# Patient Record
Sex: Female | Born: 1960 | Race: White | Hispanic: No | Marital: Married | Smoking: Never smoker
Health system: Southern US, Community
[De-identification: ages and names within clinical notes are randomized; demographics above are authoritative.]

---

## 2001-10-16 ENCOUNTER — Other Ambulatory Visit: Admission: RE | Admit: 2001-10-16 | Discharge: 2001-10-16 | Payer: Self-pay | Admitting: Family Medicine

## 2004-07-10 ENCOUNTER — Ambulatory Visit: Payer: Self-pay | Admitting: Family Medicine

## 2005-09-19 ENCOUNTER — Ambulatory Visit: Payer: Self-pay | Admitting: Family Medicine

## 2005-10-01 ENCOUNTER — Ambulatory Visit: Payer: Self-pay | Admitting: Family Medicine

## 2006-10-04 IMAGING — US ULTRASOUND RIGHT BREAST
1 series · 17 of 22 positions shown · non-contrast
Comparison: none

REASON FOR EXAM: Nodularity 6 o'clock
COMMENTS:

[Series 1: ultrasound right breast · 17 of 22 slices shown]
[im 1/22]
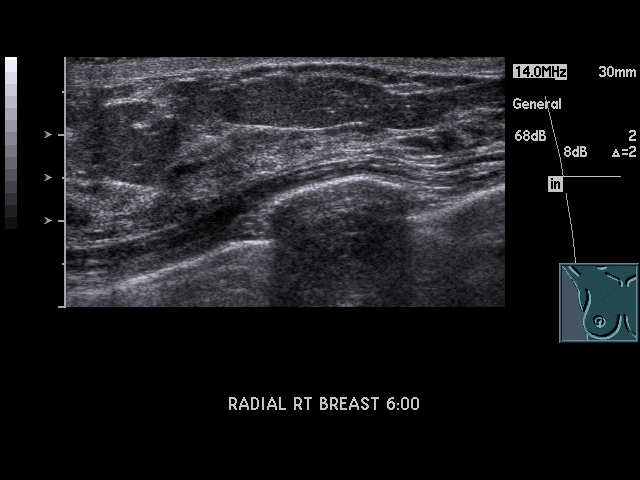
[im 2/22]
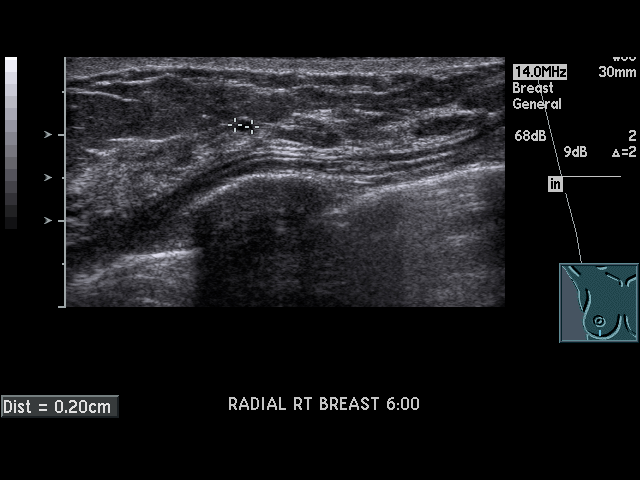
[im 4/22]
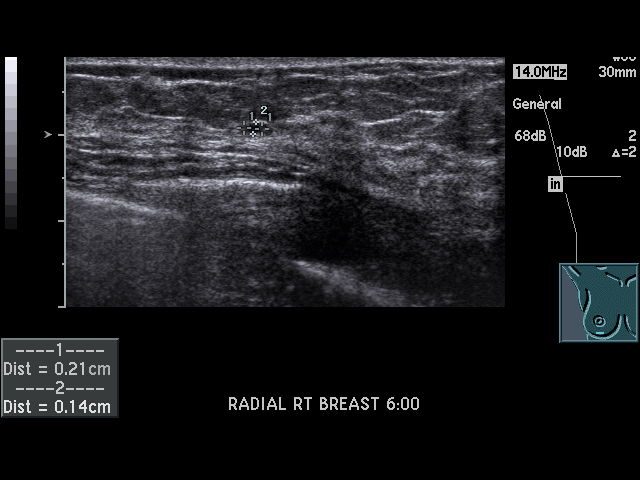
[im 5/22]
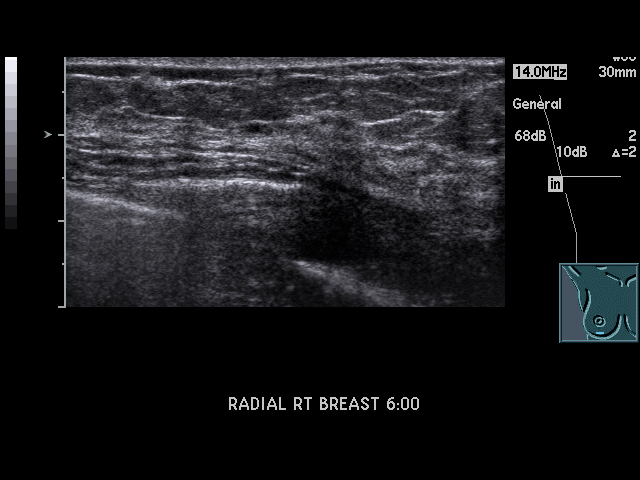
[im 6/22]
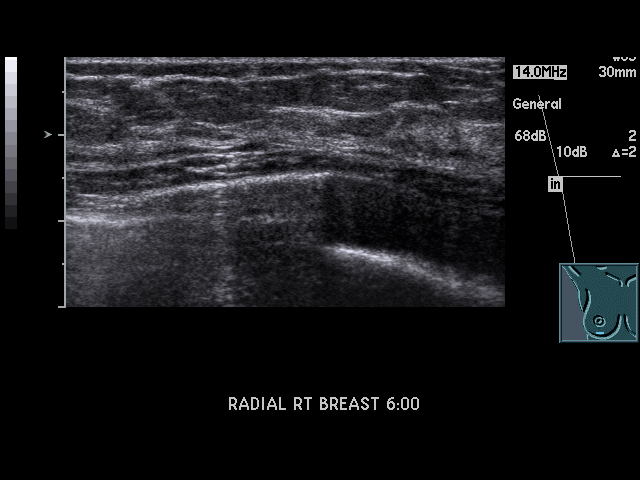
[im 8/22]
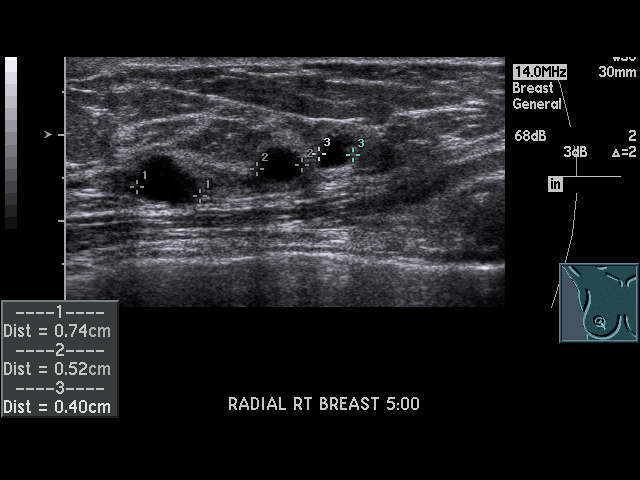
[im 9/22]
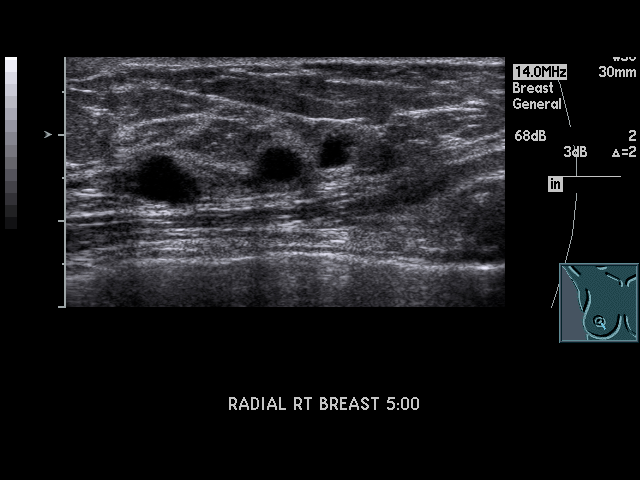
[im 10/22]
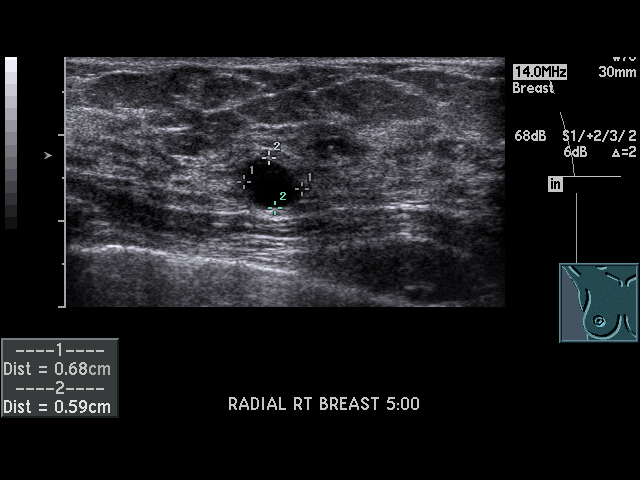
[im 12/22]
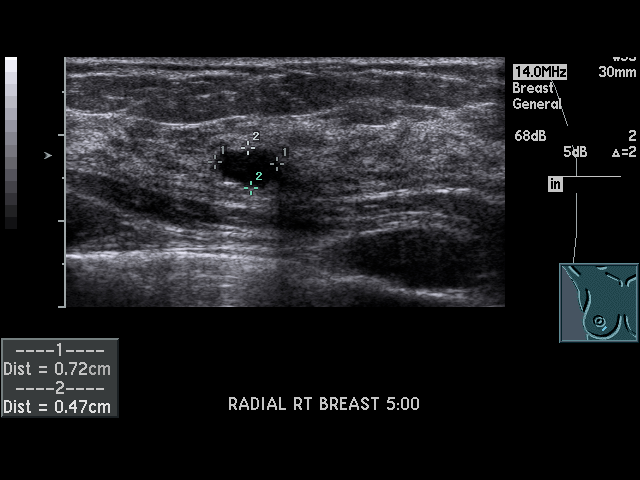
[im 13/22]
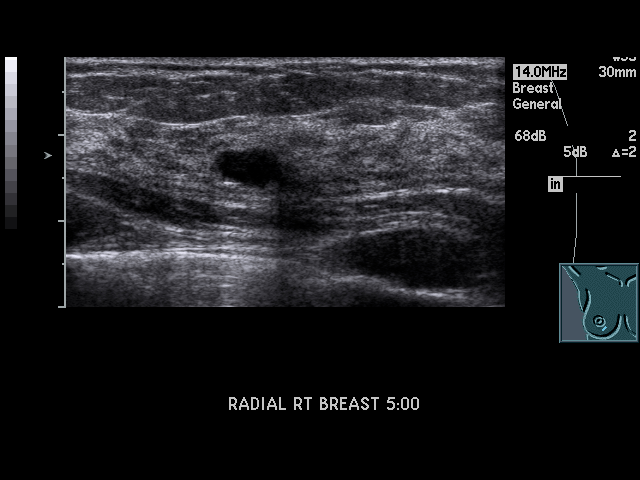
[im 14/22]
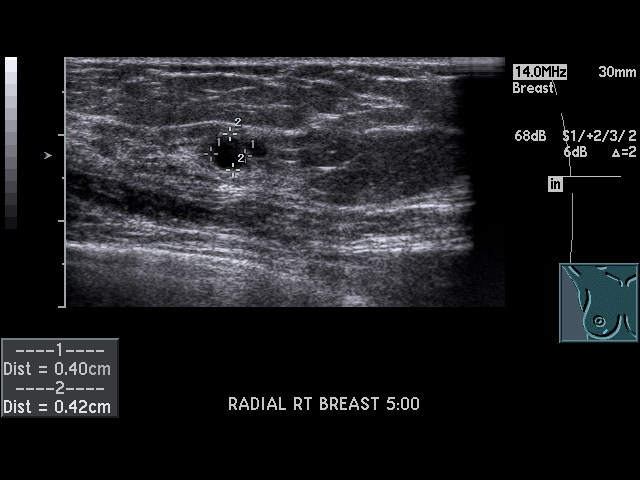
[im 15/22]
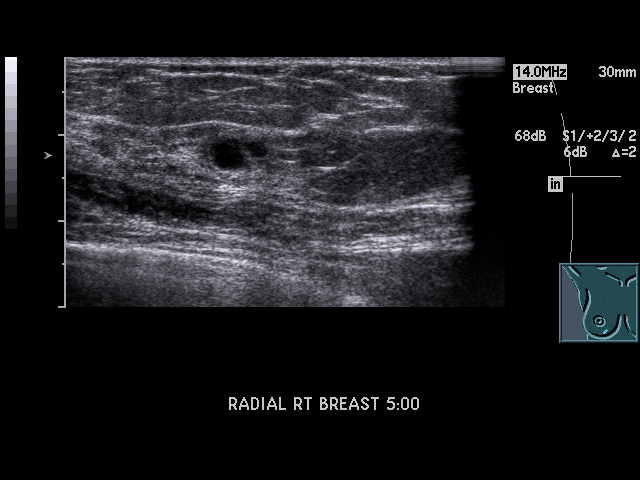
[im 17/22]
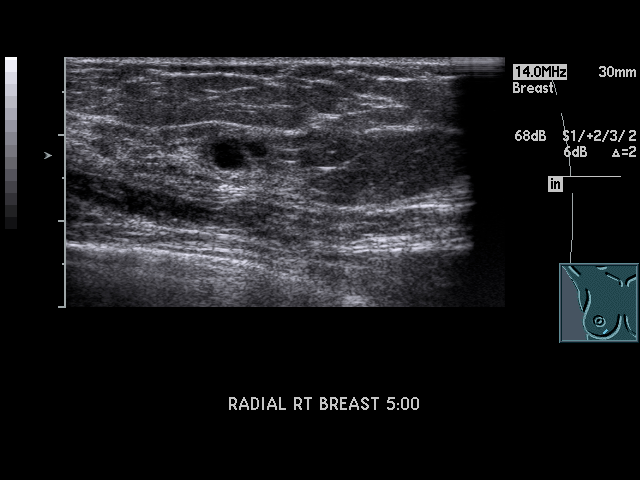
[im 18/22]
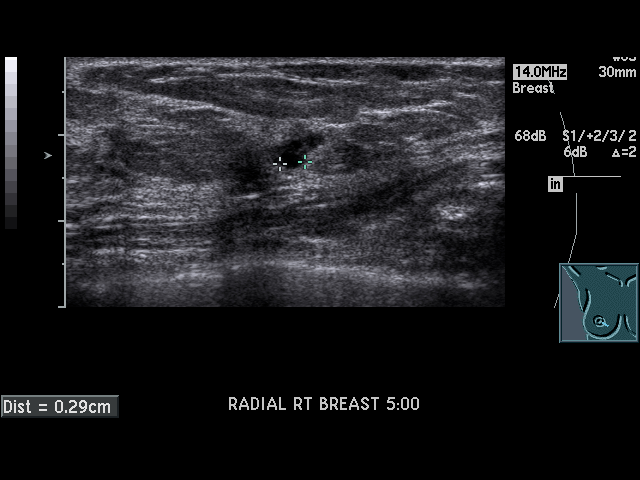
[im 19/22]
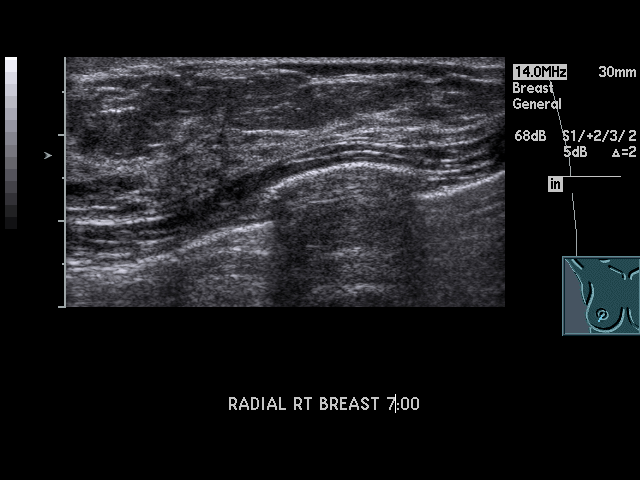
[im 21/22]
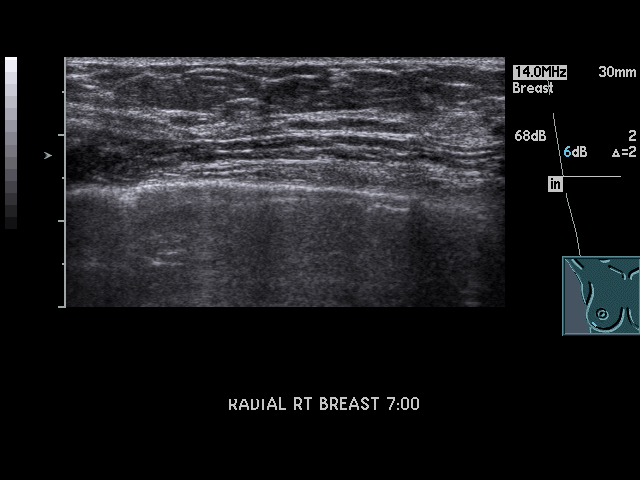
[im 22/22]
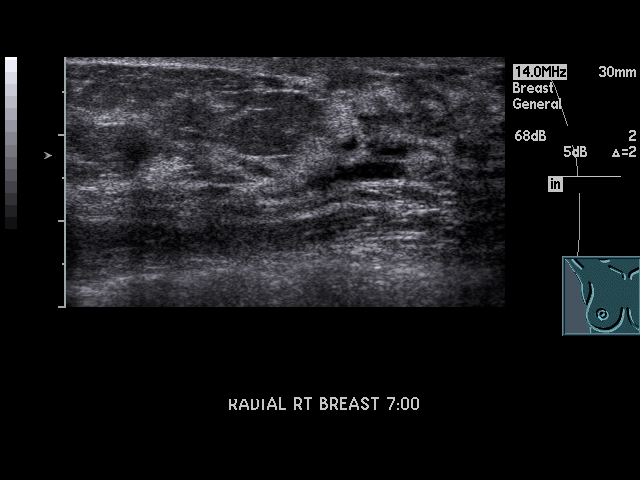

[17 of 22 positions shown; findings below may reference images not displayed]

PROCEDURE:     US  - US BREAST RIGHT  - October 01, 2005  [DATE]

RESULT:     On the patient's screening study of 09/19/05 nodularity was
noted on the RIGHT and ultrasound was requested from the 4 o'clock to 8
o'clock position.

The RIGHT breast demonstrates the presence of multiple cystic-appearing
structures.  At the 6 o'clock position there is an approximately 2 mm
diameter cyst.  At the 5 o'clock position there are a total of four cysts
seen.  The largest of these measures approximately 7 mm in greatest
dimension.  I see no suspicious shadowing or other areas of abnormality.
IMPRESSION: There are multiple cystic-appearing structures at the 4 o'clock to 8 o'clock
position of the RIGHT breast, which is felt to be consistent with the
patient's mammographic findings.

RECOMMENDATION: Please return to yearly mammographic followup of both
breasts.

## 2006-10-30 DIAGNOSIS — R319 Hematuria, unspecified: Secondary | ICD-10-CM | POA: Insufficient documentation

## 2006-11-20 ENCOUNTER — Ambulatory Visit: Payer: Self-pay | Admitting: Family Medicine

## 2006-11-27 ENCOUNTER — Ambulatory Visit: Payer: Self-pay | Admitting: Family Medicine

## 2008-08-31 ENCOUNTER — Ambulatory Visit: Payer: Self-pay | Admitting: Family Medicine

## 2010-07-25 LAB — HM PAP SMEAR: HM Pap smear: NEGATIVE

## 2010-08-29 ENCOUNTER — Ambulatory Visit: Payer: Self-pay

## 2011-06-05 ENCOUNTER — Ambulatory Visit: Payer: Self-pay | Admitting: Family Medicine

## 2011-09-18 ENCOUNTER — Ambulatory Visit: Payer: Self-pay | Admitting: Family Medicine

## 2012-06-07 IMAGING — US US PELV - US TRANSVAGINAL
1 series · 17 of 25 positions shown · non-contrast
Comparison: none

REASON FOR EXAM: menorrhagia
COMMENTS:

PROCEDURE:     US  - US PELVIS EXAM W/TRANSVAGINAL  - June 05, 2011  [DATE]
RESULT:     Comparison: None
INDICATION: Menorrhagia.
TECHNIQUE: Multiple transabdominal gray-scale images and endovaginal
gray-scale images with doppler images of the pelvis performed.

[Series 1: us pelv - us transvaginal · 17 of 88 slices shown]
[im 1/88]
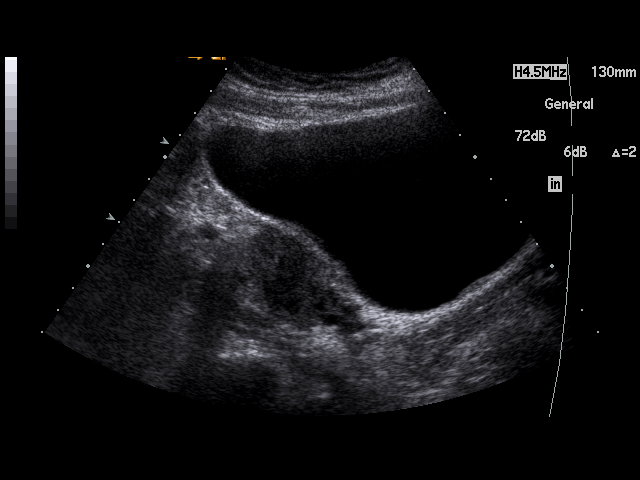
[im 8/88]
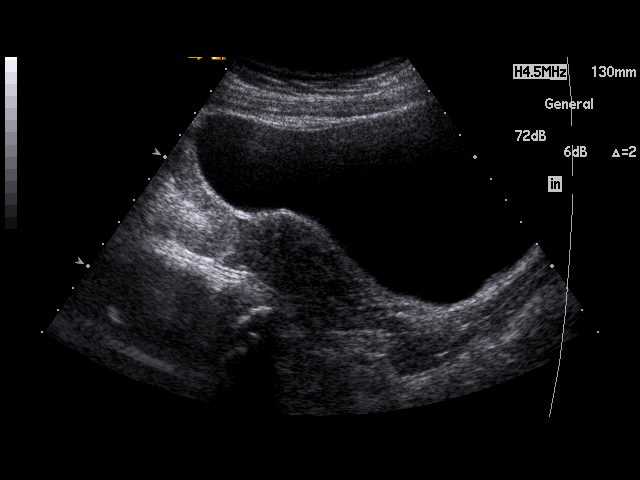
[im 11/88]
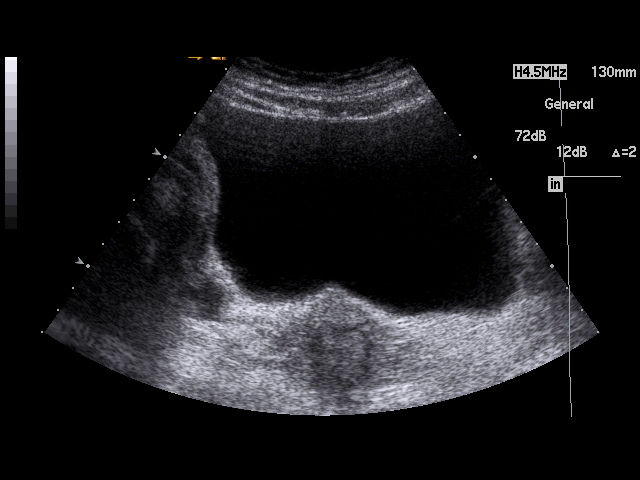
[im 19/88]
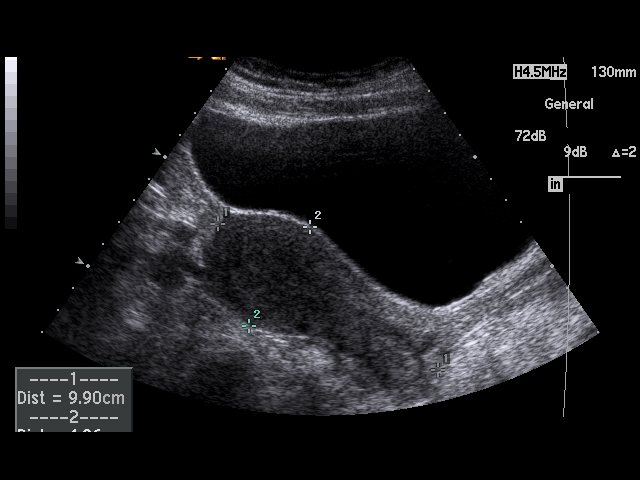
[im 22/88]
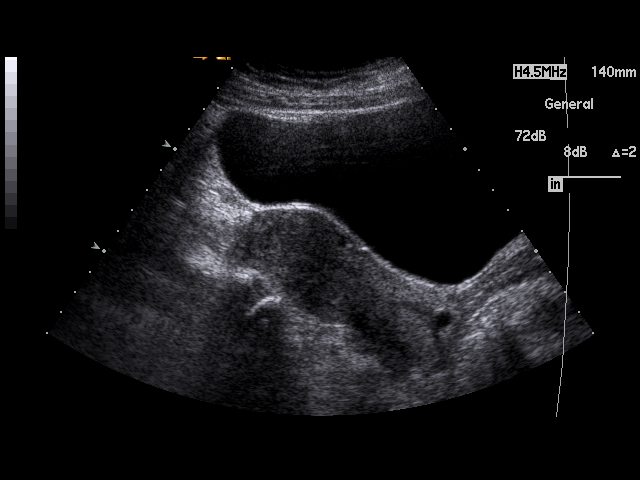
[im 30/88]
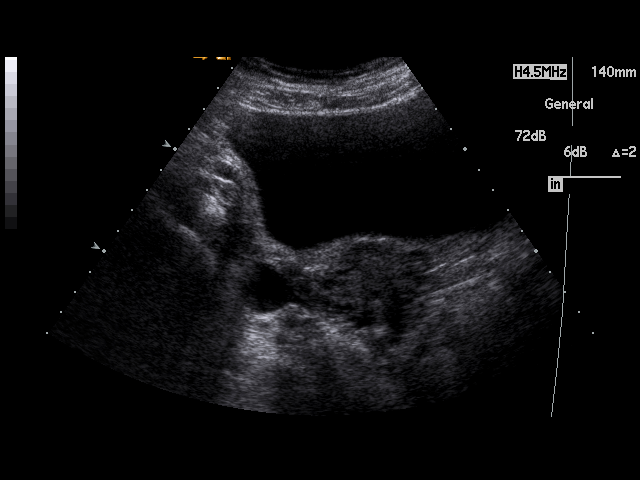
[im 33/88]
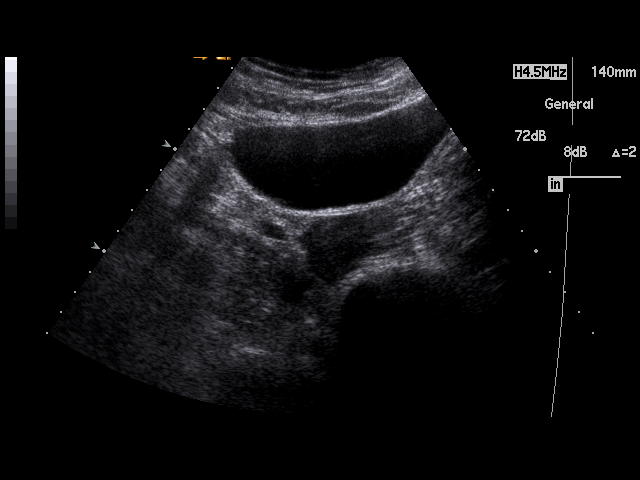
[im 40/88]
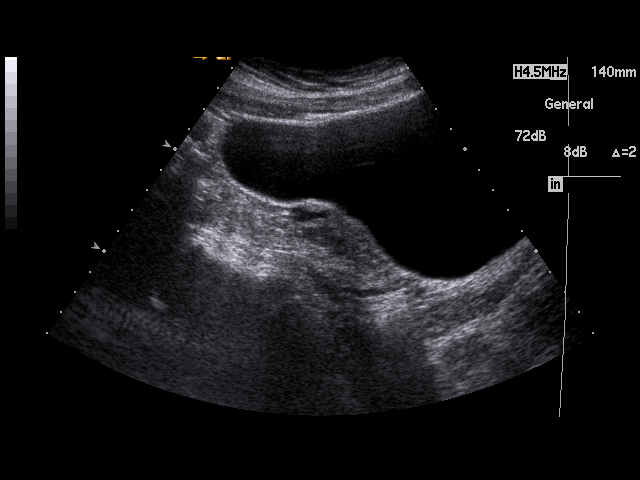
[im 44/88]
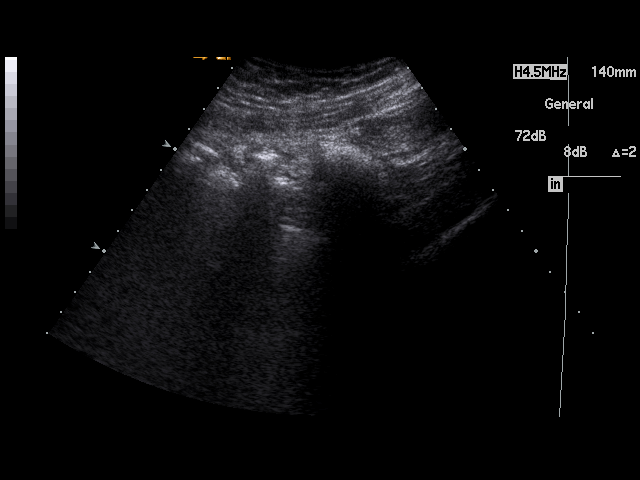
[im 48/88]
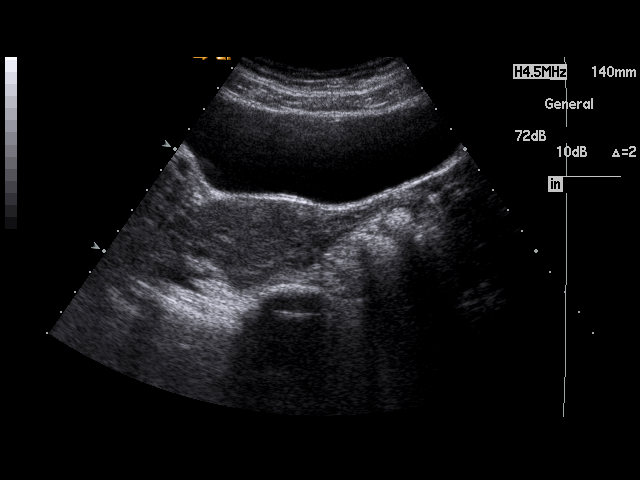
[im 55/88]
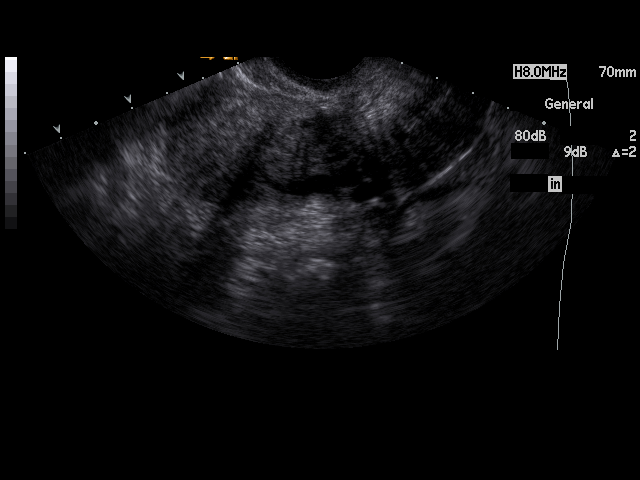
[im 59/88]
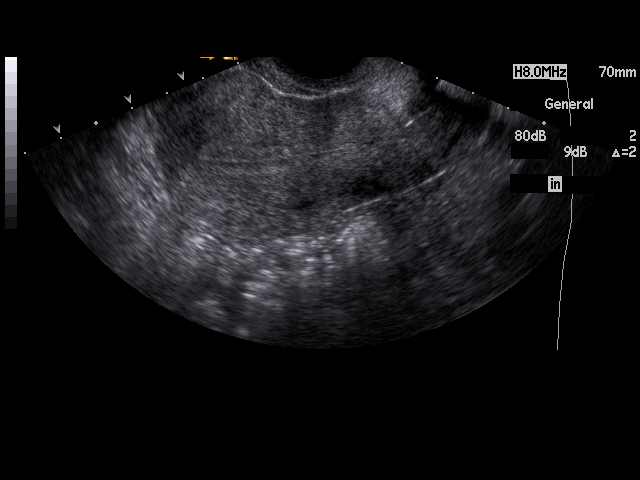
[im 66/88]
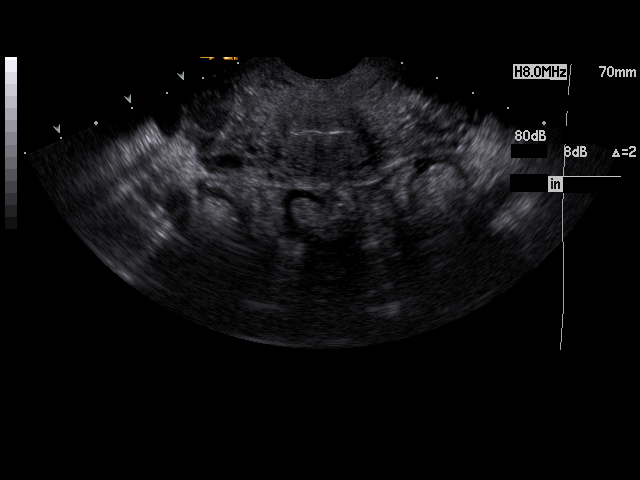
[im 69/88]
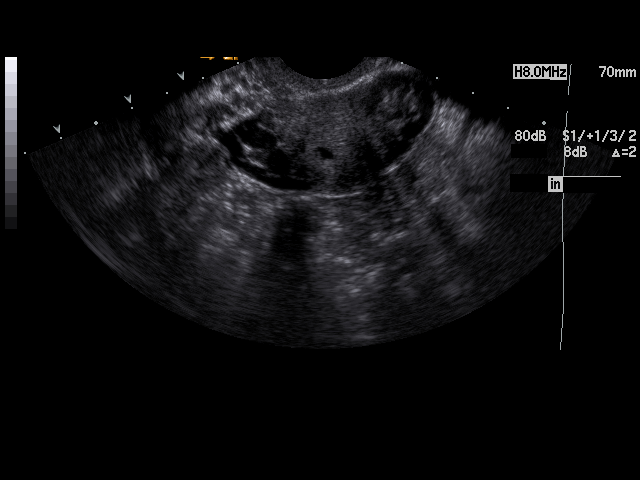
[im 77/88]
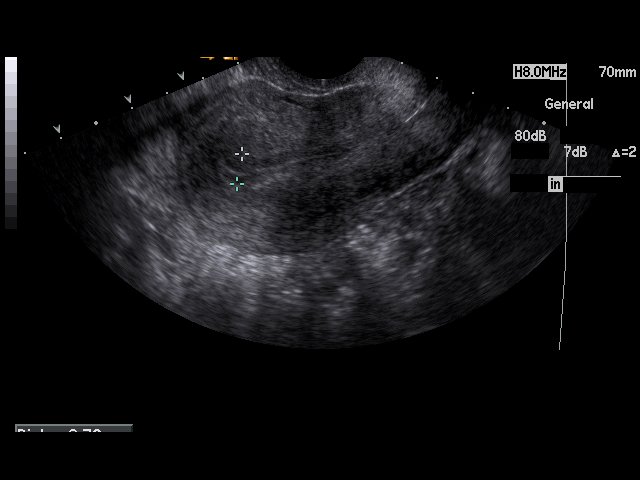
[im 80/88]
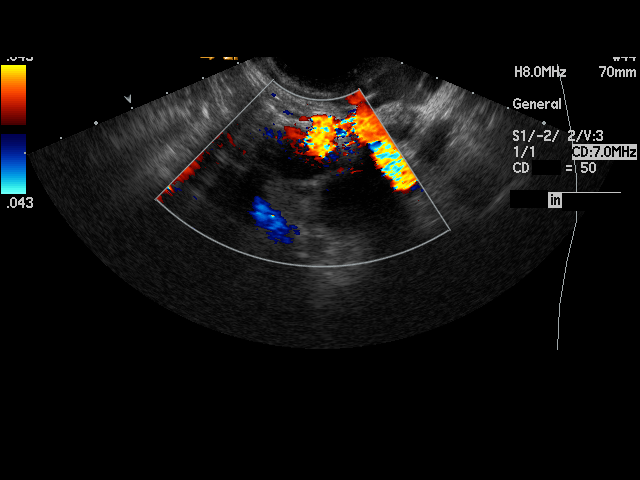
[im 88/88]
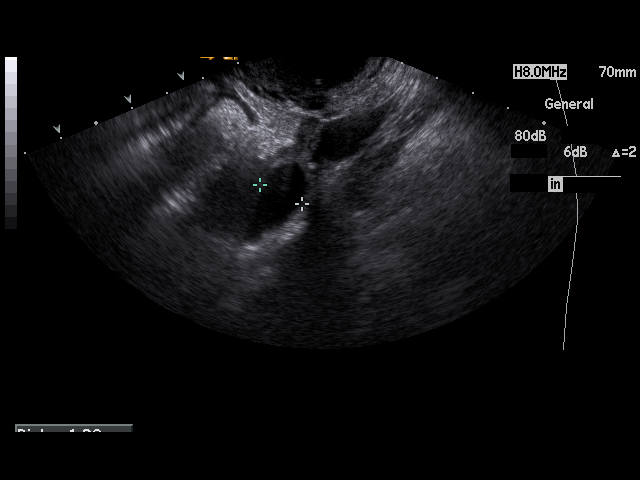

[17 of 25 positions shown; findings below may reference images not displayed]

FINDINGS: The uterus is normal in echotexture measuring 9.9 x 4.4 x 5.4 cm , with
transabdominal ultrasound. The endometrial stripe is uniform and homogeneous
measuring 9.4 mm.  There are no abnormal solid or cystic myometrial mass
lesions noted.

The right ovary measures 3.4 x 2.6 x 2.9 cm.  The left ovary measures 2.7 x
1.8 x 2.2 cm. There is no adnexal mass.

There is no pelvic free fluid.
IMPRESSION: Normal pelvic ultrasound.

## 2012-09-20 IMAGING — MG MM CAD SCREENING MAMMO
1 series · 4 of 4 positions shown · non-contrast
Comparison: none

REASON FOR EXAM: scr mammo no order
COMMENTS:

PROCEDURE:     MAM - MAM DGTL SCRN MAM NO ORDER W/CAD  - September 18, 2011  [DATE]
RESULT:     COMPARISON:  08/29/2010, 08/31/2008, 11/20/2006
TECHNIQUE: Digital screening mammograms were obtained. FDA approved
computer-aided detection (CAD) for mammography was utilized for this study.

[Series 8449: R CC · right · 4 of 4 slices shown]
[im 1/4]
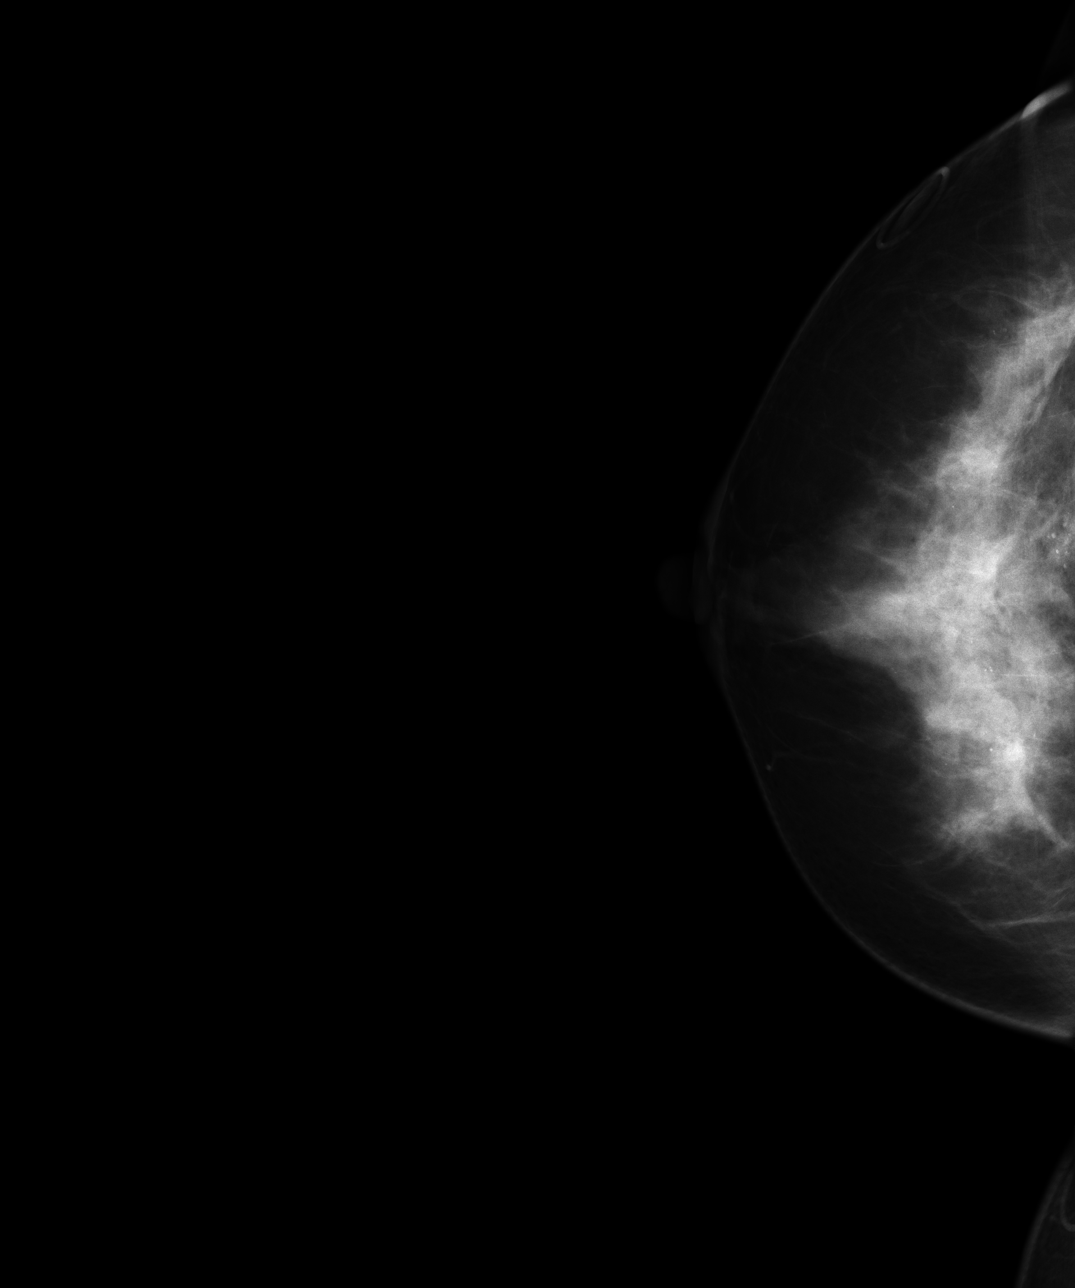
[im 2/4]
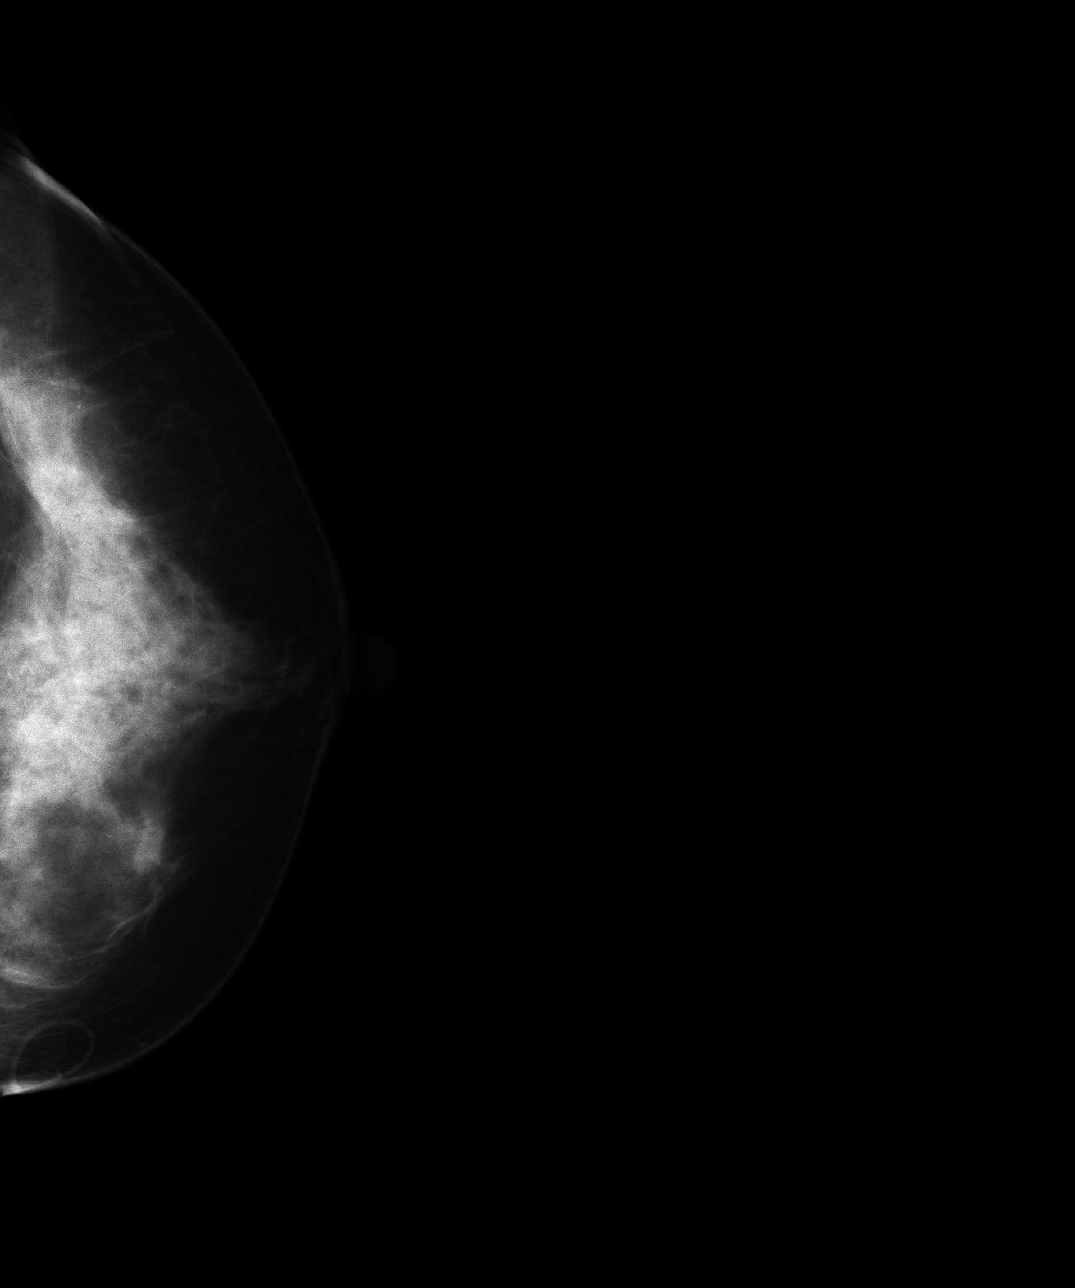
[im 3/4]
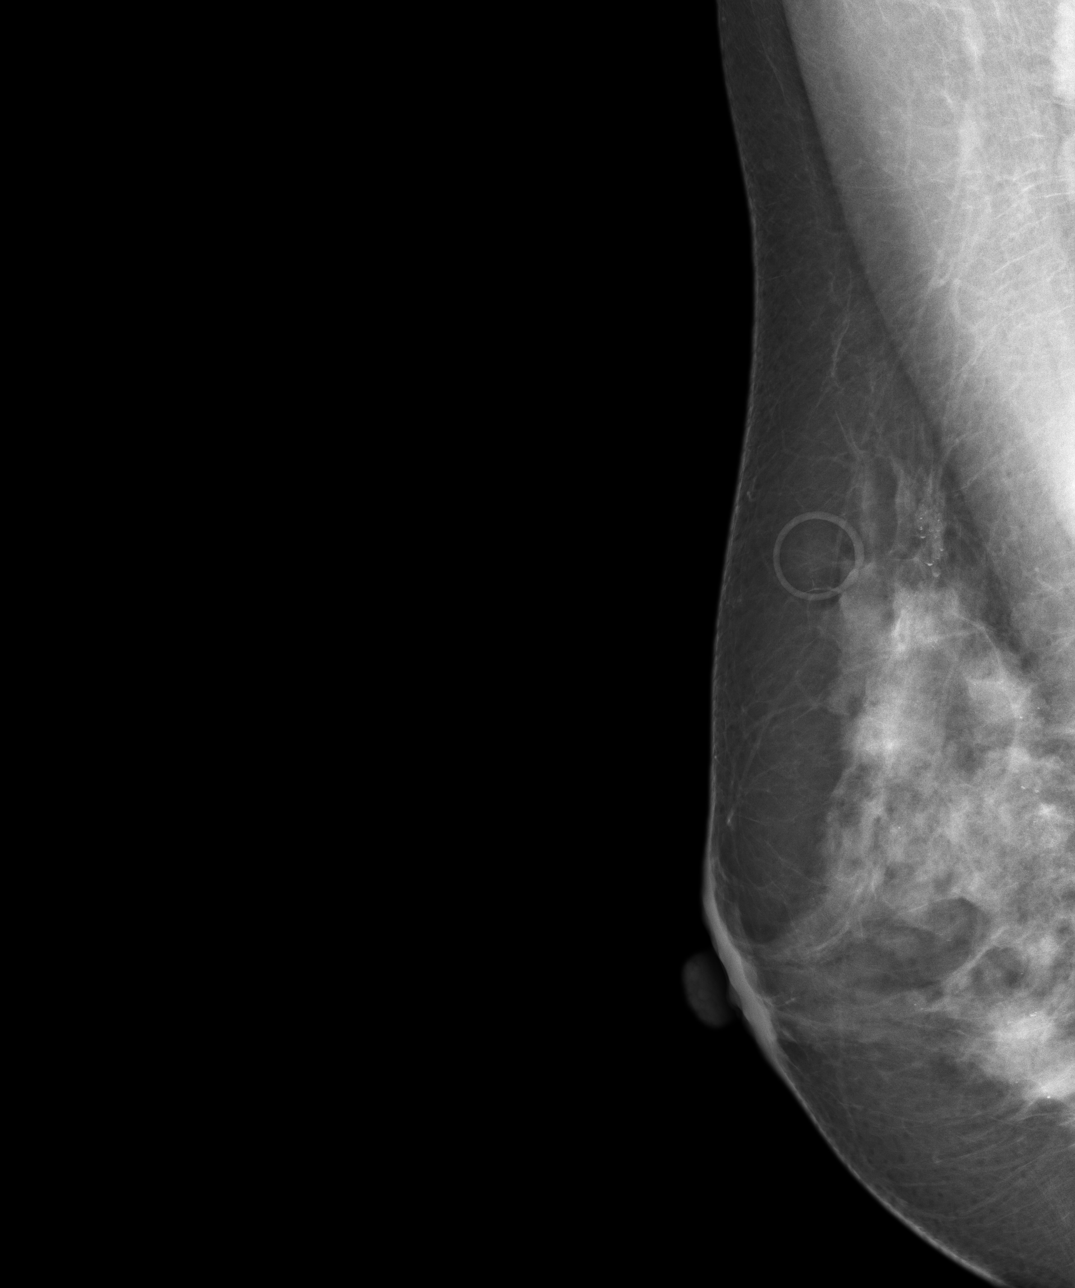
[im 4/4]
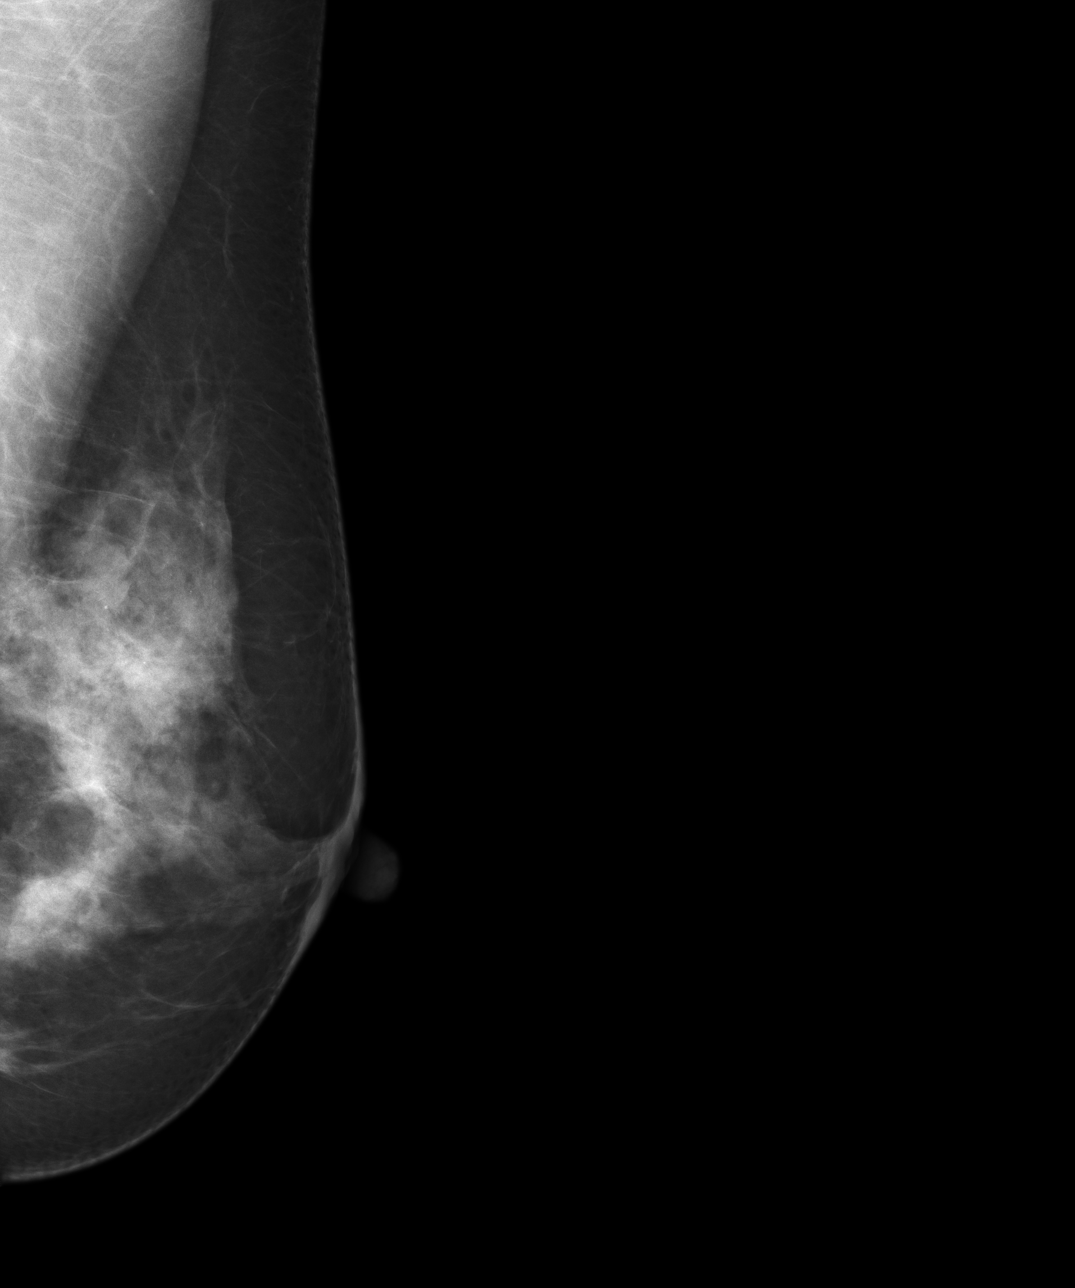

[4 of 4 positions shown; findings below may reference images not displayed]

FINDING: Bilateral breasts are dense lowering the sensitivity of mammography. There
are ossifications in the posterior third of the right breast which on the
MLO view layer dependently consistent with milk of calcium. There is no
dominant mass, architectural distortion or clusters of suspicious
microcalcifications.
IMPRESSION: 1.     Stable bilateral mammogram.
2.     Annual mammographic follow up recommended.
3.     BI-RADS:  Category 2- Benign.

A negative mammogram report does not preclude biopsy or other evaluation of
a clinically palpable or otherwise suspicious mass or lesion. Breast cancer
may not be detected by mammography in up to 10% of cases.

## 2013-08-10 ENCOUNTER — Ambulatory Visit: Payer: Self-pay | Admitting: Family Medicine

## 2013-08-10 LAB — HM HEPATITIS C SCREENING LAB: HM Hepatitis Screen: NEGATIVE

## 2014-10-19 ENCOUNTER — Ambulatory Visit: Payer: Self-pay | Admitting: Family Medicine

## 2014-10-19 LAB — HM MAMMOGRAPHY

## 2014-11-07 ENCOUNTER — Ambulatory Visit: Payer: Self-pay | Admitting: Gastroenterology

## 2014-11-07 LAB — HM COLONOSCOPY

## 2016-05-09 ENCOUNTER — Ambulatory Visit (INDEPENDENT_AMBULATORY_CARE_PROVIDER_SITE_OTHER): Payer: BC Managed Care – PPO | Admitting: Family Medicine

## 2016-05-09 ENCOUNTER — Encounter: Payer: Self-pay | Admitting: Family Medicine

## 2016-05-09 VITALS — BP 110/64 | HR 72 | Temp 98.2°F | Resp 16 | Ht 68.0 in | Wt 170.0 lb

## 2016-05-09 DIAGNOSIS — N3090 Cystitis, unspecified without hematuria: Secondary | ICD-10-CM

## 2016-05-09 DIAGNOSIS — E559 Vitamin D deficiency, unspecified: Secondary | ICD-10-CM | POA: Insufficient documentation

## 2016-05-09 DIAGNOSIS — J309 Allergic rhinitis, unspecified: Secondary | ICD-10-CM | POA: Insufficient documentation

## 2016-05-09 DIAGNOSIS — E7800 Pure hypercholesterolemia, unspecified: Secondary | ICD-10-CM | POA: Insufficient documentation

## 2016-05-09 DIAGNOSIS — Z9889 Other specified postprocedural states: Secondary | ICD-10-CM | POA: Insufficient documentation

## 2016-05-09 LAB — POCT UA - MICROSCOPIC ONLY
CASTS, UR, LPF, POC: NONE SEEN
CRYSTALS, UR, HPF, POC: NONE SEEN
EPITHELIAL CELLS, URINE PER MICROSCOPY: NONE SEEN
MUCUS UA: NONE SEEN
YEAST UA: NONE SEEN

## 2016-05-09 LAB — POCT URINALYSIS DIPSTICK
GLUCOSE UA: NEGATIVE
KETONES UA: NEGATIVE
Nitrite, UA: POSITIVE
Protein, UA: NEGATIVE
SPEC GRAV UA: 1.01
Urobilinogen, UA: 0.2
pH, UA: 7.5

## 2016-05-09 MED ORDER — NITROFURANTOIN MONOHYD MACRO 100 MG PO CAPS: 100.0000 mg | ORAL_CAPSULE | Freq: Two times a day (BID) | ORAL | 0 refills | Status: DC

## 2016-05-09 NOTE — Patient Instructions (Signed)

## 2016-05-09 NOTE — Progress Notes (Signed)
Patient: Morgan Cantrell Female    DOB: [birth year]   55 y.o.   MRN: [id number] Visit Date: 05/09/2016  Today's Provider: Dortha Kern, PA   Chief Complaint  Patient presents with  . Urinary Tract Infection   Subjective:    Urinary Tract Infection   This is a new problem. The current episode started yesterday. The problem has been unchanged. The quality of the pain is described as burning and aching ("pressure"). The pain is at a severity of 3/10. The pain is mild. There has been no fever. She is sexually active. There is no history of pyelonephritis. Associated symptoms include frequency and urgency. Pertinent negatives include no chills, discharge, flank pain, hematuria, hesitancy, nausea, sweats or vomiting. Treatments tried: AZO. The treatment provided moderate relief.   Urinalysis    Component Value Date/Time   BILIRUBINUR small 05/09/2016 1121   PROTEINUR negative 05/09/2016 1121   UROBILINOGEN 0.2 05/09/2016 1121   NITRITE positive 05/09/2016 1121   LEUKOCYTESUR moderate (2+) (A) 05/09/2016 1121    History reviewed. No pertinent past medical history. Patient Active Problem List   Diagnosis Date Noted  . Allergic rhinitis 05/09/2016  . H/O surgical procedure 05/09/2016  . Hypercholesterolemia without hypertriglyceridemia 05/09/2016  . Avitaminosis D 05/09/2016  . Low grade squamous intraepithelial lesion (LGSIL) on Papanicolaou smear of cervix 08/02/2008  . Blood in the urine 10/30/2006  . Melanoma of skin (HCC) 09/30/1986   History reviewed. No pertinent surgical history.   History reviewed. No pertinent family history.  Allergies  Allergen Reactions  . Penicillins   . Sulfa Antibiotics Rash and Swelling   Current Meds  Medication Sig  . Calcium-Vitamin D 500-125 MG-UNIT TABS Take by mouth.  . Omega-3 Fatty Acids (FISH OIL BURP-LESS) 1000 MG CAPS Take by mouth.    Review of Systems  Constitutional: Negative for chills.  Gastrointestinal: Negative  for nausea and vomiting.  Genitourinary: Positive for frequency and urgency. Negative for flank pain, hematuria and hesitancy.    Social History  Substance Use Topics  . Smoking status: Never Smoker  . Smokeless tobacco: Never Used  . Alcohol use Yes     Comment: occasional   Objective:   BP 110/64 (BP Location: Right Arm, Patient Position: Sitting, Cuff Size: Normal)   Pulse 72   Temp 98.2 F (36.8 C) (Oral)   Resp 16   Ht  (1.727 m)   Wt 170 lb (77.1 kg)   BMI 25.85 kg/m   Physical Exam  Constitutional: She is oriented to person, place, and time. She appears well-developed and well-nourished. No distress.  HENT:  Head: Normocephalic and atraumatic.  Right Ear: Hearing normal.  Left Ear: Hearing normal.  Nose: Nose normal.  Eyes: Conjunctivae and lids are normal. Right eye exhibits no discharge. Left eye exhibits no discharge. No scleral icterus.  Cardiovascular: Normal rate and regular rhythm.   Pulmonary/Chest: Effort normal and breath sounds normal. No respiratory distress.  Abdominal: Soft. Bowel sounds are normal. There is tenderness.  Suprapubic with no CVA tenderness to percussion posteriorly.  Musculoskeletal: Normal range of motion.  Neurological: She is alert and oriented to person, place, and time.  Skin: Skin is intact. No lesion and no rash noted.  Psychiatric: She has a normal mood and affect. Her speech is normal and behavior is normal. Thought content normal.      Assessment & Plan:     1. Cystitis Onset of dysuria and urgency over the past 24  hours. States she had an UTI 2 years ago with the same symptoms. Urinalysis showed large amount of RBC's and WBC's with 1+ bacteria. AZO-Standard helping to calm symptoms. May continue this and add Macrobid. Increase fluid intake and recheck pending C&S report. - Urine culture - POCT UA - Microscopic Only - POCT urinalysis dipstick - nitrofurantoin, macrocrystal-monohydrate, (MACROBID) 100 MG capsule; Take 1  capsule (100 mg total) by mouth 2 (two) times daily.  Dispense: 14 capsule; Refill: 0       Dortha Kernennis Laurissa Cowper, PA  [facility]

## 2016-05-11 LAB — URINE CULTURE

## 2016-05-13 ENCOUNTER — Telehealth: Payer: Self-pay

## 2016-05-13 NOTE — Telephone Encounter (Signed)
LMTCB 05/13/2016  Thanks,   -Laura  

## 2016-05-13 NOTE — Telephone Encounter (Signed)
-----   Message from Tamsen Roersennis E Chrismon, [place] sent at 05/13/2016  8:46 AM EDT ----- Culture confirms E.coli as bacteria causing infection. It is sensitive to all antibiotics tested except Ampicillin. Nitrofurantoin (Macrobid) should clear it up. Drink extra fluids to flush system and finish all the antibiotic. Recheck if any symptoms remain in 10 days.

## 2016-05-14 NOTE — Telephone Encounter (Signed)
Patient advised as directed below.   Thanks, Nikki 

## 2016-05-14 NOTE — Telephone Encounter (Signed)
Pt returned call.  She said you could leave a  Message if they dont answer.  [name],Morgan Cantrell

## 2016-11-11 ENCOUNTER — Ambulatory Visit (INDEPENDENT_AMBULATORY_CARE_PROVIDER_SITE_OTHER): Payer: BC Managed Care – PPO | Admitting: Family Medicine

## 2016-11-11 ENCOUNTER — Encounter: Payer: Self-pay | Admitting: Family Medicine

## 2016-11-11 VITALS — BP 124/66 | HR 76 | Temp 98.3°F | Resp 16 | Wt 173.0 lb

## 2016-11-11 DIAGNOSIS — J069 Acute upper respiratory infection, unspecified: Secondary | ICD-10-CM | POA: Diagnosis not present

## 2016-11-11 DIAGNOSIS — B9789 Other viral agents as the cause of diseases classified elsewhere: Secondary | ICD-10-CM

## 2016-11-11 MED ORDER — HYDROCODONE-HOMATROPINE 5-1.5 MG/5ML PO SYRP: ORAL_SOLUTION | ORAL | 0 refills | Status: DC

## 2016-11-11 NOTE — Progress Notes (Signed)
Subjective:     Patient ID: Morgan Cantrell, female   DOB: April 22, 1961, 56 y.o.   MRN: [id number]  HPI  Chief Complaint  Patient presents with  . Cough    x 6 days. Was improving but worsened last night. Productive with greenish-yellow and bloody sputum. Pt is afebrile. Denies chills. Is also c/o PND.  Reports clear sinus drainage but cough has worsened: " I couldn't sleep last night".Has been taking Mucinex DM for her sx. No hx of asthma.   Review of Systems     Objective:   Physical Exam  Constitutional: She appears well-developed and well-nourished. No distress.  Ears: T.M's intact without inflammation Throat: no tonsillar enlargement or exudate Neck: no cervical adenopathy Lungs: clear     Assessment:    1. Viral upper respiratory tract infection - HYDROcodone-homatropine (HYCODAN) 5-1.5 MG/5ML syrup; 5 ml 4-6 hours as needed for cough  Dispense: 240 mL; Refill: 0    Plan:    Continue Mucinex and Delsym as needed.

## 2016-11-11 NOTE — Patient Instructions (Signed)
Discussed use of Mucinex expectorant and Delsym for cough.

## 2017-04-28 ENCOUNTER — Encounter: Payer: Self-pay | Admitting: Physician Assistant

## 2017-04-28 ENCOUNTER — Ambulatory Visit (INDEPENDENT_AMBULATORY_CARE_PROVIDER_SITE_OTHER): Payer: BC Managed Care – PPO | Admitting: Physician Assistant

## 2017-04-28 VITALS — BP 116/72 | HR 64 | Resp 16 | Wt 174.0 lb

## 2017-04-28 DIAGNOSIS — H60332 Swimmer's ear, left ear: Secondary | ICD-10-CM

## 2017-04-28 MED ORDER — CIPROFLOXACIN-DEXAMETHASONE 0.3-0.1 % OT SUSP: 4.0000 [drp] | Freq: Two times a day (BID) | OTIC | 0 refills | Status: AC

## 2017-04-28 NOTE — Patient Instructions (Signed)
Otitis Externa Otitis externa is an infection of the outer ear canal. The outer ear canal is the area between the outside of the ear and the eardrum. Otitis externa is sometimes called "swimmer's ear." Follow these instructions at home:  If you were given antibiotic ear drops, use them as told by your doctor. Do not stop using them even if your condition gets better.  Take over-the-counter and prescription medicines only as told by your doctor.  Keep all follow-up visits as told by your doctor. This is important. How is this prevented?  Keep your ear dry. Use the corner of a towel to dry your ear after you swim or bathe.  Try not to scratch or put things in your ear. Doing these things makes it easier for germs to grow in your ear.  Avoid swimming in lakes, dirty water, or pools that may not have the right amount of a chemical called chlorine.  Consider making ear drops and putting 3 or 4 drops in each ear after you swim. Ask your doctor about how you can make ear drops. Contact a doctor if:  You have a fever.  After 3 days your ear is still red, swollen, or painful.  After 3 days you still have pus coming from your ear.  Your redness, swelling, or pain gets worse.  You have a really bad headache.  You have redness, swelling, pain, or tenderness behind your ear. This information is not intended to replace advice given to you by your health care provider. Make sure you discuss any questions you have with your health care provider. Document Released: 03/04/2008 Document Revised: 10/12/2015 Document Reviewed: 06/26/2015 Elsevier Interactive Patient Education  2018 [employer]  

## 2017-04-28 NOTE — Progress Notes (Signed)
Patient: Morgan Cantrell Female    DOB: Aug 25, 1961   56 y.o.   MRN: [id number] Visit Date: 04/28/2017  Today's Provider: Trey SailorsAdriana M Pollak, PA-C   Chief Complaint  Patient presents with  . Ear Pain    Started Friday   Subjective:      Morgan Cantrell is a 56 y/o woman presenting today with left sided otalgia. She was recently on vacation where she went swimming. A few days ago she developed left ear pain and drainage. It hurts for her to lie down on a pillow on her left side. Denies fever, chills, neck or jaw pain. Right ear is fine. Her hearing is muffled on the left side.  Otalgia   There is pain in the left ear. This is a new problem. The current episode started in the past 7 days. The problem occurs constantly. The problem has been gradually worsening. There has been no fever. The pain is at a severity of 4/10. Associated symptoms include ear discharge and hearing loss. Pertinent negatives include no abdominal pain, coughing, diarrhea, headaches, neck pain, rash, rhinorrhea, sore throat or vomiting. She has tried ear drops, acetaminophen and NSAIDs for the symptoms. The treatment provided no relief.       Allergies  Allergen Reactions  . Penicillins   . Sulfa Antibiotics Rash and Swelling     Current Outpatient Prescriptions:  .  Calcium-Vitamin D 500-125 MG-UNIT TABS, Take by mouth., Disp: , Rfl:  .  fexofenadine (ALLEGRA ALLERGY) 180 MG tablet, Take by mouth., Disp: , Rfl:  .  Multiple Vitamin (MULTIVITAMIN) capsule, Take 1 capsule by mouth daily., Disp: , Rfl:  .  Omega-3 Fatty Acids (FISH OIL BURP-LESS) 1000 MG CAPS, Take by mouth., Disp: , Rfl:  .  fluticasone (FLONASE) 50 MCG/ACT nasal spray, Place into the nose., Disp: , Rfl:   Review of Systems  Constitutional: Negative.   HENT: Positive for ear discharge, ear pain and hearing loss. Negative for congestion, facial swelling, nosebleeds, postnasal drip, rhinorrhea, sinus pain, sinus pressure, sneezing, sore  throat, tinnitus, trouble swallowing and voice change.   Eyes: Negative.   Respiratory: Negative.  Negative for cough.   Gastrointestinal: Negative.  Negative for abdominal pain, diarrhea and vomiting.  Musculoskeletal: Negative for neck pain.  Skin: Negative for rash.  Neurological: Negative for dizziness, light-headedness and headaches.    Social History  Substance Use Topics  . Smoking status: Never Smoker  . Smokeless tobacco: Never Used  . Alcohol use Yes     Comment: occasional   Objective:   There were no vitals taken for this visit. There were no vitals filed for this visit.   Physical Exam  Constitutional: She is oriented to person, place, and time. She appears well-developed and well-nourished.  HENT:  Right Ear: Tympanic membrane and external ear normal.  Left Ear: Tympanic membrane normal. There is tenderness.  Mouth/Throat: Oropharynx is clear and moist. No oropharyngeal exudate.  Left sided tragal tenderness. Extremely edematous EAC, difficult to pass scope.   Neck: Neck supple.  Cardiovascular: Normal rate and regular rhythm.   Pulmonary/Chest: Effort normal and breath sounds normal.  Lymphadenopathy:    She has no cervical adenopathy.  Neurological: She is alert and oriented to person, place, and time.  Skin: Skin is warm and dry.  Psychiatric: She has a normal mood and affect. Her behavior is normal.        Assessment & Plan:     1. Acute  swimmer's ear of left side  - ciprofloxacin-dexamethasone (CIPRODEX) OTIC suspension; Place 4 drops into the left ear 2 (two) times daily.  Dispense: 7.5 mL; Refill: 0  Return if symptoms worsen or fail to improve.  The entirety of the information documented in the History of Present Illness, Review of Systems and Physical Exam were personally obtained by me. Portions of this information were initially documented by Kavin LeechLaura Walsh, CMA and reviewed by me for thoroughness and accuracy.        Trey SailorsAdriana M Pollak, PA-C    [facility]

## 2017-06-03 ENCOUNTER — Encounter: Payer: Self-pay | Admitting: Family Medicine

## 2017-07-11 ENCOUNTER — Encounter: Payer: Self-pay | Admitting: Family Medicine
